# Patient Record
Sex: Male | Born: 2000 | Race: Black or African American | Hispanic: No | Marital: Single
Health system: Midwestern US, Community
[De-identification: ages and names within clinical notes are randomized; demographics above are authoritative.]

---
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
## 2019-11-11 ENCOUNTER — Emergency Department: Admit: 2019-11-12

## 2019-11-11 DIAGNOSIS — S8991XA Unspecified injury of right lower leg, initial encounter: Secondary | ICD-10-CM

## 2019-11-11 NOTE — ED Notes (Signed)
Purposeful rounding completed. Toileting offered, ongoing plan of care discussed and pt???s concerns/questions addressed. Pain reassessed. Pt informed of time factors with lab/imaging study results. Pt resting on the stretcher in a position of comfort. Call bell within reach.

## 2019-11-11 NOTE — ED Notes (Signed)
Patient states "I can't take pills. Really, I cant's swallow them."    Motrin crushed and combined with pudding. Patient able to take medication.

## 2019-11-11 NOTE — ED Provider Notes (Signed)
History of ADHD.  He presents with right knee pain.  He states that he injured it while playing 7 on 7 football (not tackle) approximately 6 hours ago.  He states that it started after attempting to jump off his right leg while playing.  He has not been able to bear weight since then.  The pain is severe (10 out of 10) and worse with movement.  He has not noticed any swelling.  He does not remember if his knee bent awkwardly.  He has not had a history of previous knee problems.  No treatment prior to arrival.  No other injuries or complaints.           Past Medical History:   Diagnosis Date   ??? ADHD, hyperactive-impulsive type        No past surgical history on file.      Family History:   Problem Relation Age of Onset   ??? Liver Disease Maternal Uncle    ??? Heart Disease Maternal Uncle    ??? Diabetes Maternal Grandmother    ??? Hypertension Maternal Grandmother    ??? Stroke Other        Social History     Socioeconomic History   ??? Marital status: SINGLE     Spouse name: Not on file   ??? Number of children: Not on file   ??? Years of education: Not on file   ??? Highest education level: Not on file   Occupational History   ??? Not on file   Social Needs   ??? Financial resource strain: Not on file   ??? Food insecurity     Worry: Not on file     Inability: Not on file   ??? Transportation needs     Medical: Not on file     Non-medical: Not on file   Tobacco Use   ??? Smoking status: Current Every Day Smoker   ??? Smokeless tobacco: Never Used   Substance and Sexual Activity   ??? Alcohol use: No   ??? Drug use: No   ??? Sexual activity: Never   Lifestyle   ??? Physical activity     Days per week: Not on file     Minutes per session: Not on file   ??? Stress: Not on file   Relationships   ??? Social Wellsite geologist on phone: Not on file     Gets together: Not on file     Attends religious service: Not on file     Active member of club or organization: Not on file     Attends meetings of clubs or organizations: Not on file      Relationship status: Not on file   ??? Intimate partner violence     Fear of current or ex partner: Not on file     Emotionally abused: Not on file     Physically abused: Not on file     Forced sexual activity: Not on file   Other Topics Concern   ??? Not on file   Social History Narrative    ** Merged History Encounter **              ALLERGIES: Patient has no known allergies.    Review of Systems   All other systems reviewed and are negative.      There were no vitals filed for this visit.         Physical Exam  Vitals signs and  nursing note reviewed.   Constitutional:       Appearance: He is well-developed.   HENT:      Head: Normocephalic and atraumatic.   Eyes:      Conjunctiva/sclera: Conjunctivae normal.   Neck:      Trachea: No tracheal deviation.   Cardiovascular:      Rate and Rhythm: Normal rate.   Pulmonary:      Effort: Pulmonary effort is normal.   Abdominal:      General: There is no distension.   Musculoskeletal:      Comments: His right knee is diffusely tender.  No swelling noted.  He reports pain with valgus/varus stress and internal/external rotation.  Limited range of motion due to pain.  2+ pedal pulses.   Skin:     General: Skin is dry.   Neurological:      Mental Status: He is alert.          MDM       Procedures    Progress Note:  Results, treatment, and follow up plan have been discussed with patient.  Questions were answered.   Satira Anis, MD  9:07 PM    Assessment/plan: Right knee injury while playing football earlier today.  Unclear etiology.  He is very tender and resists range of motion.  However, no swelling is appreciated.  Neurovascularly intact.  X-ray showed no acute process.  Home with recommendations of rest, ice, elevation, ibuprofen, and orthopedic follow-up.  We will provide an Ace wrap and crutches.  Satira Anis, MD  9:08 PM

## 2019-11-11 NOTE — ED Notes (Signed)
Assumed care of patient in [facility] 3. Patient arrived via wheelchair. States has not been able to ambulate since injury; "someone carried me."

## 2019-11-11 NOTE — ED Notes (Signed)
The patient was discharged home by provider in stable condition. The patient is alert and oriented, in no respiratory distress and discharge vital signs obtained. The patient's diagnosis, condition and treatment were explained. The patient expressed understanding. No prescriptions given. No work/school note given. A discharge plan has been developed. A case manager was not involved in the process. Aftercare instructions were given.   Pt discharged from the ED via w/c by   By RN to family in parking lot.

## 2019-11-11 NOTE — ED Triage Notes (Signed)
Patient complains of right knee pain while jumping during football today.  Unaware of any specific trauma.  Patient reports difficulty with walking

## 2019-11-11 NOTE — ED Notes (Signed)
 Purposeful rounding completed. Toileting offered, ongoing plan of care discussed and pt's concerns/questions addressed. Pain reassessed. Pt informed of time factors with lab/imaging study results. Pt resting on the stretcher in a position of comfort. Call bell within reach.

## 2019-11-11 NOTE — ED Notes (Signed)
 Patient states I can't take pills. Really, I cant's swallow them.    Motrin crushed and combined with pudding. Patient able to take medication.

## 2019-11-11 NOTE — ED Notes (Signed)
Patient complains of right knee pain while jumping during football today.  Unaware of any specific trauma.  Patient reports difficulty with walking

## 2019-11-11 NOTE — ED Notes (Signed)
The patient was discharged home by provider in stable condition. The patient is alert and oriented, in no respiratory distress and discharge vital signs obtained. The patient's diagnosis, condition and treatment were explained. The patient expressed understanding. No prescriptions given. No work/school note given. A discharge plan has been developed. A case manager was not involved in the process. Aftercare instructions were given.   Pt discharged from the ED via w/c by   By RN to family in parking lot.

## 2019-11-11 NOTE — ED Notes (Signed)
 Assumed care of patient in [facility]. Patient arrived via wheelchair. States has not been able to ambulate since injury; someone carried me.

## 2019-11-11 NOTE — ED Provider Notes (Signed)
History of ADHD.  He presents with right knee pain.  He states that he injured it while playing 7 on 7 football (not tackle) approximately 6 hours ago.  He states that it started after attempting to jump off his right leg while playing.  He has not been able to bear weight since then.  The pain is severe (10 out of 10) and worse with movement.  He has not noticed any swelling.  He does not remember if his knee bent awkwardly.  He has not had a history of previous knee problems.  No treatment prior to arrival.  No other injuries or complaints.           Past Medical History:   Diagnosis Date   ??? ADHD, hyperactive-impulsive type        No past surgical history on file.      Family History:   Problem Relation Age of Onset   ??? Liver Disease Maternal Uncle    ??? Heart Disease Maternal Uncle    ??? Diabetes Maternal Grandmother    ??? Hypertension Maternal Grandmother    ??? Stroke Other        Social History     Socioeconomic History   ??? Marital status: SINGLE     Spouse name: Not on file   ??? Number of children: Not on file   ??? Years of education: Not on file   ??? Highest education level: Not on file   Occupational History   ??? Not on file   Social Needs   ??? Financial resource strain: Not on file   ??? Food insecurity     Worry: Not on file     Inability: Not on file   ??? Transportation needs     Medical: Not on file     Non-medical: Not on file   Tobacco Use   ??? Smoking status: Current Every Day Smoker   ??? Smokeless tobacco: Never Used   Substance and Sexual Activity   ??? Alcohol use: No   ??? Drug use: No   ??? Sexual activity: Never   Lifestyle   ??? Physical activity     Days per week: Not on file     Minutes per session: Not on file   ??? Stress: Not on file   Relationships   ??? Social Wellsite geologist on phone: Not on file     Gets together: Not on file     Attends religious service: Not on file     Active member of club or organization: Not on file     Attends meetings of clubs or organizations: Not on file     Relationship  status: Not on file   ??? Intimate partner violence     Fear of current or ex partner: Not on file     Emotionally abused: Not on file     Physically abused: Not on file     Forced sexual activity: Not on file   Other Topics Concern   ??? Not on file   Social History Narrative    ** Merged History Encounter **              ALLERGIES: Patient has no known allergies.    Review of Systems   All other systems reviewed and are negative.      There were no vitals filed for this visit.         Physical Exam  Vitals signs and  nursing note reviewed.   Constitutional:       Appearance: He is well-developed.   HENT:      Head: Normocephalic and atraumatic.   Eyes:      Conjunctiva/sclera: Conjunctivae normal.   Neck:      Trachea: No tracheal deviation.   Cardiovascular:      Rate and Rhythm: Normal rate.   Pulmonary:      Effort: Pulmonary effort is normal.   Abdominal:      General: There is no distension.   Musculoskeletal:      Comments: His right knee is diffusely tender.  No swelling noted.  He reports pain with valgus/varus stress and internal/external rotation.  Limited range of motion due to pain.  2+ pedal pulses.   Skin:     General: Skin is dry.   Neurological:      Mental Status: He is alert.          MDM       Procedures    Progress Note:  Results, treatment, and follow up plan have been discussed with patient.  Questions were answered.   Satira Anis, MD  9:07 PM    Assessment/plan: Right knee injury while playing football earlier today.  Unclear etiology.  He is very tender and resists range of motion.  However, no swelling is appreciated.  Neurovascularly intact.  X-ray showed no acute process.  Home with recommendations of rest, ice, elevation, ibuprofen, and orthopedic follow-up.  We will provide an Ace wrap and crutches.  Satira Anis, MD  9:08 PM

## 2019-11-12 ENCOUNTER — Inpatient Hospital Stay
Admit: 2019-11-12 | Discharge: 2019-11-12 | Disposition: A | Discharge status: Home / Self Care | Attending: Emergency Medicine

## 2019-11-12 MED ORDER — IBUPROFEN 800 MG TAB
800 mg | ORAL | Status: AC
Start: 2019-11-12 — End: 2019-11-11
  Administered 2019-11-12: 01:00:00 via ORAL

## 2019-11-12 MED FILL — IBUPROFEN 800 MG TAB: 800 mg | ORAL | Qty: 1

## 2020-03-24 ENCOUNTER — Other Ambulatory Visit: Payer: Self-pay

## 2020-03-24 ENCOUNTER — Emergency Department (HOSPITAL_COMMUNITY): Payer: Medicaid Other

## 2020-03-24 ENCOUNTER — Emergency Department (HOSPITAL_COMMUNITY)
Admission: EM | Admit: 2020-03-24 | Discharge: 2020-03-24 | Disposition: A | Discharge status: Home / Self Care | Payer: Medicaid Other | Attending: Emergency Medicine | Admitting: Emergency Medicine

## 2020-03-24 ENCOUNTER — Encounter (HOSPITAL_COMMUNITY): Payer: Self-pay | Admitting: Emergency Medicine

## 2020-03-24 DIAGNOSIS — S62316A Displaced fracture of base of fifth metacarpal bone, right hand, initial encounter for closed fracture: Secondary | ICD-10-CM | POA: Diagnosis not present

## 2020-03-24 DIAGNOSIS — S62347A Nondisplaced fracture of base of fifth metacarpal bone. left hand, initial encounter for closed fracture: Secondary | ICD-10-CM | POA: Diagnosis not present

## 2020-03-24 DIAGNOSIS — Y92239 Unspecified place in hospital as the place of occurrence of the external cause: Secondary | ICD-10-CM | POA: Diagnosis not present

## 2020-03-24 DIAGNOSIS — Y999 Unspecified external cause status: Secondary | ICD-10-CM | POA: Insufficient documentation

## 2020-03-24 DIAGNOSIS — W2209XA Striking against other stationary object, initial encounter: Secondary | ICD-10-CM | POA: Insufficient documentation

## 2020-03-24 DIAGNOSIS — Y9389 Activity, other specified: Secondary | ICD-10-CM | POA: Diagnosis not present

## 2020-03-24 DIAGNOSIS — S62346A Nondisplaced fracture of base of fifth metacarpal bone, right hand, initial encounter for closed fracture: Secondary | ICD-10-CM | POA: Diagnosis not present

## 2020-03-24 DIAGNOSIS — S6991XA Unspecified injury of right wrist, hand and finger(s), initial encounter: Secondary | ICD-10-CM | POA: Diagnosis present

## 2020-03-24 DIAGNOSIS — S62306A Unspecified fracture of fifth metacarpal bone, right hand, initial encounter for closed fracture: Secondary | ICD-10-CM

## 2020-03-24 NOTE — ED Triage Notes (Addendum)
C/o R hand pain and swelling x 1 week.  States he punched glass at hospital last week.

## 2020-03-24 NOTE — ED Provider Notes (Signed)
[facility] Provider Note   CSN: [id number] Arrival date & time: 03/24/20  1039     History Chief Complaint  Patient presents with  . Hand Pain    [facility] is a 19 y.o. male.  19 year old male presents with complaint of pain in his right hand.  Patient states that he got angry and punched a window at the hospital last week, has had pain in his hand since that time.  No prior hand injuries.  Pain located over the right fifth MCP, no wounds to the hand, no other complaints or concerns.        History reviewed. No pertinent past medical history.  There are no problems to display for this patient.   History reviewed. No pertinent surgical history.     No family history on file.  Social History   Tobacco Use  . Smoking status: Never Smoker  . Smokeless tobacco: Never Used  Substance Use Topics  . Alcohol use: Never  . Drug use: Yes    Types: Marijuana    Home Medications Prior to Admission medications   Medication Sig Start Date End Date Taking? Authorizing Provider  ibuprofen (ADVIL) 200 MG tablet Take 200 mg by mouth every 6 (six) hours as needed for mild pain.   Yes [provider]  ibuprofen (ADVIL,MOTRIN) 100 MG/5ML suspension Take 30 mLs (600 mg total) by mouth every 6 (six) hours as needed. Patient not taking: Reported on 03/24/2020 04/01/18   Elisha Ponder, PA-C    Allergies    Patient has no known allergies.  Review of Systems   Review of Systems  Constitutional: Negative for fever.  Musculoskeletal: Positive for arthralgias and joint swelling.  Skin: Negative for color change and wound.  Allergic/Immunologic: Negative for immunocompromised state.  Neurological: Negative for weakness and numbness.    Physical Exam Updated Vital Signs BP 127/71 (BP Location: Left Arm)   Pulse 64   Temp (!) 97.5 F (36.4 C) (Oral)   Resp 16   SpO2 100%   Physical Exam Vitals and nursing note reviewed.   Constitutional:      General: He is not in acute distress.    Appearance: He is well-developed. He is not diaphoretic.  HENT:     Head: Normocephalic and atraumatic.  Pulmonary:     Effort: Pulmonary effort is normal.  Musculoskeletal:        General: Swelling and tenderness present.     Right hand: Swelling, tenderness and bony tenderness present. No lacerations. Decreased range of motion. Normal sensation.       Arms:  Skin:    General: Skin is warm and dry.     Capillary Refill: Capillary refill takes less than 2 seconds.     Findings: No erythema or rash.  Neurological:     Mental Status: He is alert and oriented to person, place, and time.  Psychiatric:        Behavior: Behavior normal.     ED Results / Procedures / Treatments   Labs (all labs ordered are listed, but only abnormal results are displayed) Labs Reviewed - No data to display  EKG None  Radiology DG Hand Complete Right  Result Date: 03/24/2020 CLINICAL DATA:  Pain after punching glass EXAM: RIGHT HAND - COMPLETE 3+ VIEW COMPARISON:  None. FINDINGS: Frontal, oblique, and lateral views were obtained. There is a comminuted fracture of the distal fifth metacarpal with volar angulation distally. There is soft tissue swelling  in this area. No other fracture. No dislocation. Joint spaces appear normal. No radiopaque foreign body. IMPRESSION: Comminuted fracture distal fifth metacarpal with volar angulation distally. There is soft tissue swelling in this area medially. No radiopaque foreign body. No other fracture. No dislocation. No evident arthropathy. Electronically Signed   By: Lowella Grip III M.D.   On: 03/24/2020 12:05    Procedures Procedures (including critical care time)  Medications Ordered in ED Medications - No data to display  ED Course  I have reviewed the triage vital signs and the nursing notes.  Pertinent labs & imaging results that were available during my care of the patient were  reviewed by me and considered in my medical decision making (see chart for details).  Clinical Course as of Mar 24 1232  Sun Mar 24, 2020  1232 19yo male with right hand injury after punching a window last week. Patient is right hand dominant, TTP with swelling over right 5th MCP, sensation intact, skin intact, able to flex and extend the finger with discomfort. XR with boxers fx, placed in ulnar gutter splint and referred to hand ortho.   [LM]    Clinical Course User Index [LM] Roque Lias   MDM Rules/Calculators/A&P                         SPLINT APPLICATION Date/Time: 27:78 PM Authorized by: Tacy Learn Consent: Verbal consent obtained. Risks and benefits: risks, benefits and alternatives were discussed Consent given by: patient Splint applied by: orthopedic technician Location details: right forearm Splint type: ulnar gutter Supplies used: fiberglass OCL, ace, webril Post-procedure: The splinted body part was neurovascularly unchanged following the procedure. Patient tolerance: Patient tolerated the procedure well with no immediate complications.    Final Clinical Impression(s) / ED Diagnoses Final diagnoses:  Closed nondisplaced fracture of fifth metacarpal bone of right hand, unspecified portion of metacarpal, initial encounter    Rx / DC Orders ED Discharge Orders    None       Tacy Learn, PA-C 03/24/20 1233    Isla Pence, MD 03/24/20 1440

## 2020-03-24 NOTE — Discharge Instructions (Addendum)
Keep splint on, clean, and dry. Follow up with orthopedics. You can elevate the hand and apply ice on top of the splint for 20 minutes three times daily to help with pain and swelling. Take Motrin and Tylenol as needed as directed for pain.

## 2020-03-24 NOTE — Progress Notes (Signed)
Orthopedic Tech Progress Note Patient Details:  [facility] 26-Sep-2001 [id number]  Ortho Devices Type of Ortho Device: Ulna gutter splint Ortho Device/Splint Location: RUE Ortho Device/Splint Interventions: Application, Ordered   Post Interventions Patient Tolerated: Well Instructions Provided: Care of device   Oliana Gowens A Shaliah Wann 03/24/2020, 1:01 PM

## 2020-03-25 DIAGNOSIS — S62306A Unspecified fracture of fifth metacarpal bone, right hand, initial encounter for closed fracture: Secondary | ICD-10-CM | POA: Diagnosis not present

## 2020-04-10 DIAGNOSIS — S62306D Unspecified fracture of fifth metacarpal bone, right hand, subsequent encounter for fracture with routine healing: Secondary | ICD-10-CM | POA: Diagnosis not present

## 2020-04-10 DIAGNOSIS — S62306A Unspecified fracture of fifth metacarpal bone, right hand, initial encounter for closed fracture: Secondary | ICD-10-CM | POA: Diagnosis not present

## 2020-05-21 ENCOUNTER — Ambulatory Visit: Payer: Self-pay

## 2020-05-21 ENCOUNTER — Ambulatory Visit
Admission: EM | Admit: 2020-05-21 | Discharge: 2020-05-21 | Disposition: A | Discharge status: Home / Self Care | Payer: Medicaid Other | Attending: Emergency Medicine | Admitting: Emergency Medicine

## 2020-05-21 DIAGNOSIS — Z202 Contact with and (suspected) exposure to infections with a predominantly sexual mode of transmission: Secondary | ICD-10-CM | POA: Insufficient documentation

## 2020-05-21 DIAGNOSIS — R369 Urethral discharge, unspecified: Secondary | ICD-10-CM

## 2020-05-21 MED ORDER — DOXYCYCLINE HYCLATE 100 MG PO CAPS
100.0000 mg | ORAL_CAPSULE | Freq: Two times a day (BID) | ORAL | 0 refills | Status: AC
Start: 2020-05-21 — End: 2020-05-28

## 2020-05-21 MED ORDER — CEFTRIAXONE SODIUM 500 MG IJ SOLR
500.0000 mg | Freq: Once | INTRAMUSCULAR | Status: AC
  Administered 2020-05-21: 500 mg via INTRAMUSCULAR

## 2020-05-21 NOTE — ED Provider Notes (Signed)
Renaldo Fiddler    CSN: [id number] Arrival date & time: 05/21/20  1001      History   Chief Complaint Chief Complaint  Patient presents with  . Exposure to STD    HPI Blake Hoffman is a 19 y.o. male.   Patient presents with thick yellow discharge from his penis today.  He reports known exposure to gonorrhea and chlamydia.  He denies fever, chills, rash, lesions, abdominal pain, dysuria, back pain, testicular pain, or other symptoms.  He is sexually active and does not use condoms.  The history is provided by the patient.    History reviewed. No pertinent past medical history.  There are no problems to display for this patient.   History reviewed. No pertinent surgical history.     Home Medications    Prior to Admission medications   Medication Sig Start Date End Date Taking? Authorizing Provider  doxycycline (VIBRAMYCIN) 100 MG capsule Take 1 capsule (100 mg total) by mouth 2 (two) times daily for 7 days. 05/21/20 05/28/20  Mickie Bail, NP    Family History History reviewed. No pertinent family history.  Social History Social History   Tobacco Use  . Smoking status: Never Smoker  . Smokeless tobacco: Never Used  Substance Use Topics  . Alcohol use: Not on file  . Drug use: Not on file     Allergies   Patient has no allergy information on record.   Review of Systems Review of Systems  Constitutional: Negative for chills and fever.  HENT: Negative for ear pain and sore throat.   Eyes: Negative for pain and visual disturbance.  Respiratory: Negative for cough and shortness of breath.   Cardiovascular: Negative for chest pain and palpitations.  Gastrointestinal: Negative for abdominal pain and vomiting.  Genitourinary: Positive for discharge. Negative for dysuria, hematuria and testicular pain.  Musculoskeletal: Negative for arthralgias and back pain.  Skin: Negative for color change and rash.  Neurological: Negative for seizures and syncope.    All other systems reviewed and are negative.    Physical Exam Triage Vital Signs ED Triage Vitals  Enc Vitals Group     BP      Pulse      Resp      Temp      Temp src      SpO2      Weight      Height      Head Circumference      Peak Flow      Pain Score      Pain Loc      Pain Edu?      Excl. in GC?    No data found.  Updated Vital Signs BP 116/78 (BP Location: Right Arm)   Pulse 64   Temp 98.9 F (37.2 C) (Oral)   Resp 16   SpO2 98%   Visual Acuity Right Eye Distance:   Left Eye Distance:   Bilateral Distance:    Right Eye Near:   Left Eye Near:    Bilateral Near:     Physical Exam Vitals and nursing note reviewed.  Constitutional:      Appearance: He is well-developed.  HENT:     Head: Normocephalic and atraumatic.     Mouth/Throat:     Mouth: Mucous membranes are moist.  Eyes:     Conjunctiva/sclera: Conjunctivae normal.  Cardiovascular:     Rate and Rhythm: Normal rate and regular rhythm.     Heart sounds:  No murmur heard.   Pulmonary:     Effort: Pulmonary effort is normal. No respiratory distress.     Breath sounds: Normal breath sounds.  Abdominal:     Palpations: Abdomen is soft.     Tenderness: There is no abdominal tenderness. There is no right CVA tenderness, left CVA tenderness, guarding or rebound.  Genitourinary:    Penis: Discharge present.      Testes: Normal.     Epididymis:     Right: Normal.     Left: Normal.     Comments: Thick yellow penile discharge. Musculoskeletal:     Cervical back: Neck supple.  Skin:    General: Skin is warm and dry.  Neurological:     Mental Status: He is alert.      UC Treatments / Results  Labs (all labs ordered are listed, but only abnormal results are displayed) Labs Reviewed  RPR  HIV ANTIBODY (ROUTINE TESTING W REFLEX)  CYTOLOGY, (ORAL, ANAL, URETHRAL) ANCILLARY ONLY    EKG   Radiology No results found.  Procedures Procedures (including critical care  time)  Medications Ordered in UC Medications  cefTRIAXone (ROCEPHIN) injection 500 mg (has no administration in time range)    Initial Impression / Assessment and Plan / UC Course  I have reviewed the triage vital signs and the nursing notes.  Pertinent labs & imaging results that were available during my care of the patient were reviewed by me and considered in my medical decision making (see chart for details).   Penile discharge, exposure to STD.  Urethral swab obtained for testing.  Blood obtained for HIV and RPR.  Treated with Rocephin and doxycycline.  Instructed patient to abstain from sexual activity for 7 days.  Discussed that his test results are pending and we will call him if they are positive.  Discussed that he and his sexual partners may require additional treatment.  Patient agrees to plan of care.   Final Clinical Impressions(s) / UC Diagnoses   Final diagnoses:  Exposure to STD  Penile discharge     Discharge Instructions     You were treated with an antibiotic today called Rocephin.  Take doxycycline twice a day for 7 days.    Do not have sex for 7 days.  Your tests are pending.  If your test results are positive, we will call you.  You may need additional treatment and your partner(s) may also need treatment.      Follow up with your primary care provider if your symptoms are not improving.        ED Prescriptions    Medication Sig Dispense Auth. Provider   doxycycline (VIBRAMYCIN) 100 MG capsule Take 1 capsule (100 mg total) by mouth 2 (two) times daily for 7 days. 14 capsule Mickie Bail, NP     PDMP not reviewed this encounter.   Mickie Bail, NP 05/21/20 1030

## 2020-05-21 NOTE — ED Triage Notes (Signed)
Pt presents to UC for exposure to gonorrhea and chlamydemia . Pt denies symptoms at this time. Pt requesting testing and treatment.

## 2020-05-21 NOTE — Discharge Instructions (Addendum)
You were treated with an antibiotic today called Rocephin.  Take doxycycline twice a day for 7 days.    Do not have sex for 7 days.  Your tests are pending.  If your test results are positive, we will call you.  You may need additional treatment and your partner(s) may also need treatment.      Follow up with your primary care provider if your symptoms are not improving.

## 2020-05-22 LAB — HIV ANTIBODY (ROUTINE TESTING W REFLEX): HIV Screen 4th Generation wRfx: NONREACTIVE

## 2020-05-22 LAB — RPR: RPR Ser Ql: NONREACTIVE

## 2020-05-27 LAB — CYTOLOGY, (ORAL, ANAL, URETHRAL) ANCILLARY ONLY
Chlamydia: POSITIVE — AB
Comment: NEGATIVE
Comment: NEGATIVE
Comment: NORMAL
Neisseria Gonorrhea: POSITIVE — AB
Trichomonas: NEGATIVE

## 2020-05-28 ENCOUNTER — Telehealth (HOSPITAL_COMMUNITY): Payer: Self-pay | Admitting: Emergency Medicine

## 2020-05-28 MED ORDER — AZITHROMYCIN 200 MG/5ML PO SUSR
1000.0000 mg | Freq: Once | ORAL | 0 refills | Status: AC
Start: 2020-05-28 — End: 2020-05-28

## 2020-05-28 NOTE — Telephone Encounter (Signed)
Patient states he is unable to swallow the doxycycline pills sent to the pharmacy for him.  Will reach out to provider on guidance for possible suspension.

## 2020-06-11 ENCOUNTER — Encounter (HOSPITAL_COMMUNITY): Payer: Self-pay | Admitting: Emergency Medicine

## 2020-12-09 ENCOUNTER — Encounter (HOSPITAL_COMMUNITY): Payer: Self-pay

## 2020-12-09 ENCOUNTER — Other Ambulatory Visit: Payer: Self-pay

## 2020-12-09 ENCOUNTER — Ambulatory Visit (HOSPITAL_COMMUNITY)
Admission: EM | Admit: 2020-12-09 | Discharge: 2020-12-09 | Disposition: A | Discharge status: Home / Self Care | Payer: Medicaid Other | Attending: Emergency Medicine | Admitting: Emergency Medicine

## 2020-12-09 DIAGNOSIS — Z202 Contact with and (suspected) exposure to infections with a predominantly sexual mode of transmission: Secondary | ICD-10-CM | POA: Insufficient documentation

## 2020-12-09 MED ORDER — DOXYCYCLINE HYCLATE 100 MG PO CAPS: 100.0000 mg | ORAL_CAPSULE | Freq: Two times a day (BID) | ORAL | 0 refills | Status: AC

## 2020-12-09 NOTE — ED Triage Notes (Signed)
Pt in with c/o Std exposure. States his sex partner tested positive for chlamydia  Denies any sxs

## 2020-12-09 NOTE — ED Provider Notes (Signed)
[facility]    CSN: [id number] Arrival date & time: 12/09/20  1454      History   Chief Complaint Chief Complaint  Patient presents with  . Exposure to STD    HPI Philip Kotlyar is a 20 y.o. male.   Patient presents for treatment of chlamydia, known exposure. Denies discharge, itching, odor, dysuria, frequency, urgency, abdominal pain, flank pain.   History reviewed. No pertinent past medical history.  There are no problems to display for this patient.   History reviewed. No pertinent surgical history.     Home Medications    Prior to Admission medications   Medication Sig Start Date End Date Taking? Authorizing Provider  doxycycline (VIBRAMYCIN) 100 MG capsule Take 1 capsule (100 mg total) by mouth 2 (two) times daily. 12/09/20  Yes Othelia Riederer R, NP  ibuprofen (ADVIL) 200 MG tablet Take 200 mg by mouth every 6 (six) hours as needed for mild pain.    [provider]  ibuprofen (ADVIL,MOTRIN) 100 MG/5ML suspension Take 30 mLs (600 mg total) by mouth every 6 (six) hours as needed. Patient not taking: Reported on 03/24/2020 04/01/18   Delia Chimes    Family History History reviewed. No pertinent family history.  Social History Social History   Tobacco Use  . Smoking status: Never Smoker  . Smokeless tobacco: Never Used  Substance Use Topics  . Alcohol use: Never  . Drug use: Yes    Types: Marijuana     Allergies   Patient has no known allergies.   Review of Systems Review of Systems  Constitutional: Negative.   HENT: Negative.   Respiratory: Negative.   Gastrointestinal: Negative.   Genitourinary: Negative.   Skin: Negative.   Neurological: Negative.      Physical Exam Triage Vital Signs ED Triage Vitals  Enc Vitals Group     BP 12/09/20 1527 (!) 153/71     Pulse Rate 12/09/20 1527 68     Resp 12/09/20 1527 18     Temp 12/09/20 1527 98.3 F (36.8 C)     Temp src --      SpO2 12/09/20 1527 98 %      Weight --      Height --      Head Circumference --      Peak Flow --      Pain Score 12/09/20 1526 0     Pain Loc --      Pain Edu? --      Excl. in GC? --    No data found.  Updated Vital Signs BP (!) 153/71   Pulse 68   Temp 98.3 F (36.8 C)   Resp 18   SpO2 98%   Visual Acuity Right Eye Distance:   Left Eye Distance:   Bilateral Distance:    Right Eye Near:   Left Eye Near:    Bilateral Near:     Physical Exam Constitutional:      Appearance: Normal appearance. He is normal weight.  Eyes:     Extraocular Movements: Extraocular movements intact.  Pulmonary:     Effort: Pulmonary effort is normal.  Musculoskeletal:        General: Normal range of motion.     Cervical back: Normal range of motion.  Skin:    General: Skin is warm and dry.  Neurological:     General: No focal deficit present.     Mental Status: He is alert and oriented to person,  place, and time. Mental status is at baseline.  Psychiatric:        Mood and Affect: Mood normal.        Behavior: Behavior normal.        Thought Content: Thought content normal.        Judgment: Judgment normal.      UC Treatments / Results  Labs (all labs ordered are listed, but only abnormal results are displayed) Labs Reviewed  CYTOLOGY, (ORAL, ANAL, URETHRAL) ANCILLARY ONLY    EKG   Radiology No results found.  Procedures Procedures (including critical care time)  Medications Ordered in UC Medications - No data to display  Initial Impression / Assessment and Plan / UC Course  I have reviewed the triage vital signs and the nursing notes.  Pertinent labs & imaging results that were available during my care of the patient were reviewed by me and considered in my medical decision making (see chart for details).  STD exposure chlamydia  1. Doxycycline 100mg  bid for 7 days 2. aptima swab- self collect- results in 2-3 days, will treat as needed 3. Advised to refrain from sex until treatment  complete  Final Clinical Impressions(s) / UC Diagnoses   Final diagnoses:  STD exposure     Discharge Instructions     Take antibiotic twice a day for seven days, complete all medication, do not crush or break pill, you can place in applesauce, yogurt or pudding to make it easier to swallow    Refrain from sex until treatment is complete  Lab results in 2-3 days, will go to mychart is active, if any positive for any other infection you will be called for treatment     ED Prescriptions    Medication Sig Dispense Auth. Provider   doxycycline (VIBRAMYCIN) 100 MG capsule Take 1 capsule (100 mg total) by mouth 2 (two) times daily. 14 capsule Briann Sarchet, , NP     PDMP not reviewed this encounter.   Elita Boone, NP 12/09/20 1600

## 2020-12-09 NOTE — Discharge Instructions (Addendum)
Take antibiotic twice a day for seven days, complete all medication, do not crush or break pill, you can place in applesauce, yogurt or pudding to make it easier to swallow    Refrain from sex until treatment is complete  Lab results in 2-3 days, will go to mychart is active, if any positive for any other infection you will be called for treatment

## 2020-12-11 LAB — CYTOLOGY, (ORAL, ANAL, URETHRAL) ANCILLARY ONLY
Chlamydia: NEGATIVE
Comment: NEGATIVE
Comment: NEGATIVE
Comment: NORMAL
Neisseria Gonorrhea: NEGATIVE
Trichomonas: NEGATIVE

## 2020-12-22 ENCOUNTER — Emergency Department (HOSPITAL_COMMUNITY)
Admission: EM | Admit: 2020-12-22 | Discharge: 2020-12-22 | Disposition: A | Discharge status: Home / Self Care | Payer: Medicaid Other | Attending: Emergency Medicine | Admitting: Emergency Medicine

## 2020-12-22 ENCOUNTER — Emergency Department (HOSPITAL_COMMUNITY): Payer: Medicaid Other

## 2020-12-22 ENCOUNTER — Other Ambulatory Visit: Payer: Self-pay

## 2020-12-22 DIAGNOSIS — Y92321 Football field as the place of occurrence of the external cause: Secondary | ICD-10-CM | POA: Insufficient documentation

## 2020-12-22 DIAGNOSIS — S7011XA Contusion of right thigh, initial encounter: Secondary | ICD-10-CM | POA: Diagnosis not present

## 2020-12-22 DIAGNOSIS — W2101XA Struck by football, initial encounter: Secondary | ICD-10-CM | POA: Diagnosis not present

## 2020-12-22 DIAGNOSIS — S79921A Unspecified injury of right thigh, initial encounter: Secondary | ICD-10-CM | POA: Diagnosis not present

## 2020-12-22 MED ORDER — IBUPROFEN 800 MG PO TABS
800.0000 mg | ORAL_TABLET | Freq: Once | ORAL | Status: DC
  Filled 2020-12-22: qty 1

## 2020-12-22 MED ORDER — IBUPROFEN 600 MG PO TABS: 600.0000 mg | ORAL_TABLET | Freq: Four times a day (QID) | ORAL | 0 refills | Status: AC | PRN

## 2020-12-22 NOTE — Discharge Instructions (Signed)
No football until pain is gone.

## 2020-12-22 NOTE — ED Notes (Signed)
Pt drinking cold beverage, unable to obtain temperature.

## 2020-12-22 NOTE — ED Triage Notes (Signed)
C/o R thigh pain since being kneed in thigh while playing football yesterday.

## 2020-12-22 NOTE — ED Provider Notes (Signed)
[facility] EMERGENCY DEPARTMENT Provider Note   CSN: [id number] Arrival date & time: 12/22/20  1551     History Chief Complaint  Patient presents with  . Leg Pain    Blake Hoffman is a 20 y.o. male.  Pt presents to the ED today with right thigh pain.  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  The pt said it still hurts, so he came in for eval.  He's been able to walk.        No past medical history on file.  There are no problems to display for this patient.   No past surgical history on file.     No family history on file.  Social History   Tobacco Use  . Smoking status: Never Smoker  . Smokeless tobacco: Never Used  Substance Use Topics  . Alcohol use: Never  . Drug use: Yes    Types: Marijuana    Home Medications Prior to Admission medications   Medication Sig Start Date End Date Taking? Authorizing Provider  ibuprofen (ADVIL) 600 MG tablet Take 1 tablet (600 mg total) by mouth every 6 (six) hours as needed. 12/22/20  Yes Jacalyn Lefevre, MD  doxycycline (VIBRAMYCIN) 100 MG capsule Take 1 capsule (100 mg total) by mouth 2 (two) times daily. 12/09/20   Valinda Hoar, NP    Allergies    Patient has no known allergies.  Review of Systems   Review of Systems  Musculoskeletal:       Right thigh pain  All other systems reviewed and are negative.   Physical Exam Updated Vital Signs BP 129/65 (BP Location: Right Arm)   Pulse 99   Resp 18   SpO2 100%   Physical Exam Vitals and nursing note reviewed.  Constitutional:      Appearance: Normal appearance.  HENT:     Head: Normocephalic and atraumatic.     Right Ear: External ear normal.     Left Ear: External ear normal.     Nose: Nose normal.     Mouth/Throat:     Mouth: Mucous membranes are moist.     Pharynx: Oropharynx is clear.  Eyes:     Extraocular Movements: Extraocular movements intact.      Conjunctiva/sclera: Conjunctivae normal.     Pupils: Pupils are equal, round, and reactive to light.  Cardiovascular:     Rate and Rhythm: Normal rate and regular rhythm.     Pulses: Normal pulses.     Heart sounds: Normal heart sounds.  Pulmonary:     Effort: Pulmonary effort is normal.     Breath sounds: Normal breath sounds.  Abdominal:     General: Abdomen is flat. Bowel sounds are normal.     Palpations: Abdomen is soft.  Musculoskeletal:     Cervical back: Normal range of motion and neck supple.       Legs:  Skin:    General: Skin is warm and dry.     Capillary Refill: Capillary refill takes less than 2 seconds.  Neurological:     General: No focal deficit present.     Mental Status: He is alert and oriented to person, place, and time.  Psychiatric:        Mood and Affect: Mood normal.        Behavior: Behavior normal.     ED Results / Procedures / Treatments   Labs (all labs ordered are  listed, but only abnormal results are displayed) Labs Reviewed - No data to display  EKG None  Radiology DG Femur Min 2 Views Right  Result Date: 12/22/2020 CLINICAL DATA:  Status post trauma. EXAM: RIGHT FEMUR 2 VIEWS COMPARISON:  None. FINDINGS: There is no evidence of fracture or other focal bone lesions. Soft tissues are unremarkable. IMPRESSION: Negative. Electronically Signed   By: Aram Candela M.D.   On: 12/22/2020 16:38    Procedures Procedures   Medications Ordered in ED Medications  ibuprofen (ADVIL) tablet 800 mg (800 mg Oral Patient Refused/Not Given 12/22/20 1635)    ED Course  I have reviewed the triage vital signs and the nursing notes.  Pertinent labs & imaging results that were available during my care of the patient were reviewed by me and considered in my medical decision making (see chart for details).    MDM Rules/Calculators/A&P                          No evidence of fx.  Pt is told to refrain from playing fb until pain is better.  He is to  talk with his athletic trainer.  He is given a note for work.  Return if worse.   Final Clinical Impression(s) / ED Diagnoses Final diagnoses:  Contusion of right thigh, initial encounter    Rx / DC Orders ED Discharge Orders         Ordered    ibuprofen (ADVIL) 600 MG tablet  Every 6 hours PRN        12/22/20 1644           Jacalyn Lefevre, MD 12/22/20 1646

## 2021-04-20 ENCOUNTER — Emergency Department (HOSPITAL_COMMUNITY)
Admission: EM | Admit: 2021-04-20 | Discharge: 2021-04-21 | Disposition: A | Discharge status: Home / Self Care | Payer: Medicaid Other | Attending: Emergency Medicine | Admitting: Emergency Medicine

## 2021-04-20 DIAGNOSIS — Y990 Civilian activity done for income or pay: Secondary | ICD-10-CM | POA: Insufficient documentation

## 2021-04-20 DIAGNOSIS — S60511A Abrasion of right hand, initial encounter: Secondary | ICD-10-CM | POA: Diagnosis not present

## 2021-04-20 DIAGNOSIS — S6991XA Unspecified injury of right wrist, hand and finger(s), initial encounter: Secondary | ICD-10-CM | POA: Diagnosis not present

## 2021-04-20 DIAGNOSIS — M79641 Pain in right hand: Secondary | ICD-10-CM

## 2021-04-20 DIAGNOSIS — X500XXA Overexertion from strenuous movement or load, initial encounter: Secondary | ICD-10-CM | POA: Diagnosis not present

## 2021-04-21 ENCOUNTER — Other Ambulatory Visit: Payer: Self-pay

## 2021-04-21 ENCOUNTER — Emergency Department (HOSPITAL_COMMUNITY): Payer: Medicaid Other

## 2021-04-21 DIAGNOSIS — M79641 Pain in right hand: Secondary | ICD-10-CM | POA: Diagnosis not present

## 2021-04-21 DIAGNOSIS — S6991XA Unspecified injury of right wrist, hand and finger(s), initial encounter: Secondary | ICD-10-CM | POA: Diagnosis not present

## 2021-04-21 NOTE — ED Provider Notes (Signed)
Emergency Medicine Provider Triage Evaluation Note  Blake Hoffman , a 20 y.o. male  was evaluated in triage.  Pt complains of right hand pain.  States he was playing football and his hand was stepped on.  Reports pain with use.  Needs clearance to return to work.  Review of Systems  Positive: Hand pain, swelling Negative: Numbness, weakness  Physical Exam  Ht 5\' 8"  (1.727 m)   Wt 66.7 kg   BMI 22.35 kg/m  Gen:   Awake, no distress   Resp:  Normal effort  MSK:   Mild swelling of right hand Other:    Medical Decision Making  Medically screening exam initiated at 12:07 AM.  Appropriate orders placed.  Blake Hoffman was informed that the remainder of the evaluation will be completed by another provider, this initial triage assessment does not replace that evaluation, and the importance of remaining in the ED until their evaluation is complete.   Rulon Eisenmenger, PA-C 04/21/21 0008    Mesner, 06/22/21, MD 04/21/21 [phone]

## 2021-04-21 NOTE — ED Triage Notes (Signed)
Pt states  yesterday around 6 pm sustained an injury to right hand when someone stepped on right hand.

## 2021-04-21 NOTE — ED Provider Notes (Signed)
[facility] EMERGENCY DEPARTMENT Provider Note   CSN: [id number] Arrival date & time: 04/20/21  2315     History Chief Complaint  Patient presents with   Right Hand Injury    Arther Heisler is a 20 y.o. male.  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  He had some pain and swelling to that area and was at work lifting boxes when they told him he needed to get medical clearance for the same.  Patient denies any other problems at this time.       No past medical history on file.  There are no problems to display for this patient.   No past surgical history on file.     No family history on file.  Social History   Tobacco Use   Smoking status: Never   Smokeless tobacco: Never  Substance Use Topics   Alcohol use: Never   Drug use: Yes    Types: Marijuana    Home Medications Prior to Admission medications   Medication Sig Start Date End Date Taking? Authorizing Provider  doxycycline (VIBRAMYCIN) 100 MG capsule Take 1 capsule (100 mg total) by mouth 2 (two) times daily. 12/09/20   Valinda Hoar, NP  ibuprofen (ADVIL) 600 MG tablet Take 1 tablet (600 mg total) by mouth every 6 (six) hours as needed. 12/22/20   Jacalyn Lefevre, MD    Allergies    Patient has no known allergies.  Review of Systems   Review of Systems  All other systems reviewed and are negative.  Physical Exam Updated Vital Signs BP (!) 139/97 (BP Location: Right Arm)   Pulse 63   Temp 97.6 F (36.4 C) (Oral)   Resp 16   Ht 5\' 8"  (1.727 m)   Wt 66.7 kg   SpO2 99%   BMI 22.35 kg/m   Physical Exam Vitals and nursing note reviewed.  Constitutional:      Appearance: He is well-developed.  HENT:     Head: Normocephalic and atraumatic.     Mouth/Throat:     Mouth: Mucous membranes are moist.     Pharynx: Oropharynx is clear.  Eyes:     Pupils: Pupils are equal, round, and reactive to light.  Cardiovascular:     Rate  and Rhythm: Normal rate.  Pulmonary:     Effort: Pulmonary effort is normal. No respiratory distress.  Abdominal:     General: Abdomen is flat. There is no distension.  Musculoskeletal:        General: Normal range of motion.     Cervical back: Normal range of motion.  Skin:    General: Skin is warm and dry.     Comments: Has a couple small abrasions to the dorsum of his right hand no obvious deformities or significant swelling for that matter..    Neurological:     General: No focal deficit present.     Mental Status: He is alert.    ED Results / Procedures / Treatments   Labs (all labs ordered are listed, but only abnormal results are displayed) Labs Reviewed - No data to display  EKG None  Radiology DG Hand Complete Right  Result Date: 04/21/2021 CLINICAL DATA:  Someone stepped on hand with pain, initial encounter EXAM: RIGHT HAND - COMPLETE 3+ VIEW COMPARISON:  03/24/2020 FINDINGS: Previously seen fifth metacarpal fracture has healed with mild curvature distally. No acute fracture or dislocation is noted. No soft tissue  abnormality is seen. No radiopaque foreign body is noted. IMPRESSION: No acute abnormality noted. Healed fifth metacarpal fracture. Electronically Signed   By: Alcide Clever M.D.   On: 04/21/2021 00:38    Procedures Procedures   Medications Ordered in ED Medications - No data to display  ED Course  I have reviewed the triage vital signs and the nursing notes.  Pertinent labs & imaging results that were available during my care of the patient were reviewed by me and considered in my medical decision making (see chart for details).    MDM Rules/Calculators/A&P                          X-ray negative.  Work note provided.  Will use anti-inflammatories at home.  Final Clinical Impression(s) / ED Diagnoses Final diagnoses:  Pain of right hand    Rx / DC Orders ED Discharge Orders     None        Jahira Swiss, Barbara Cower, MD 04/21/21 [phone]

## 2021-04-30 IMAGING — CR DG FEMUR 2+V*R*
4 series · 4 of 4 positions shown · non-contrast
Comparison: None.

CLINICAL DATA: Status post trauma.

EXAM:
RIGHT FEMUR 2 VIEWS

[femur ap (1 of 2)]
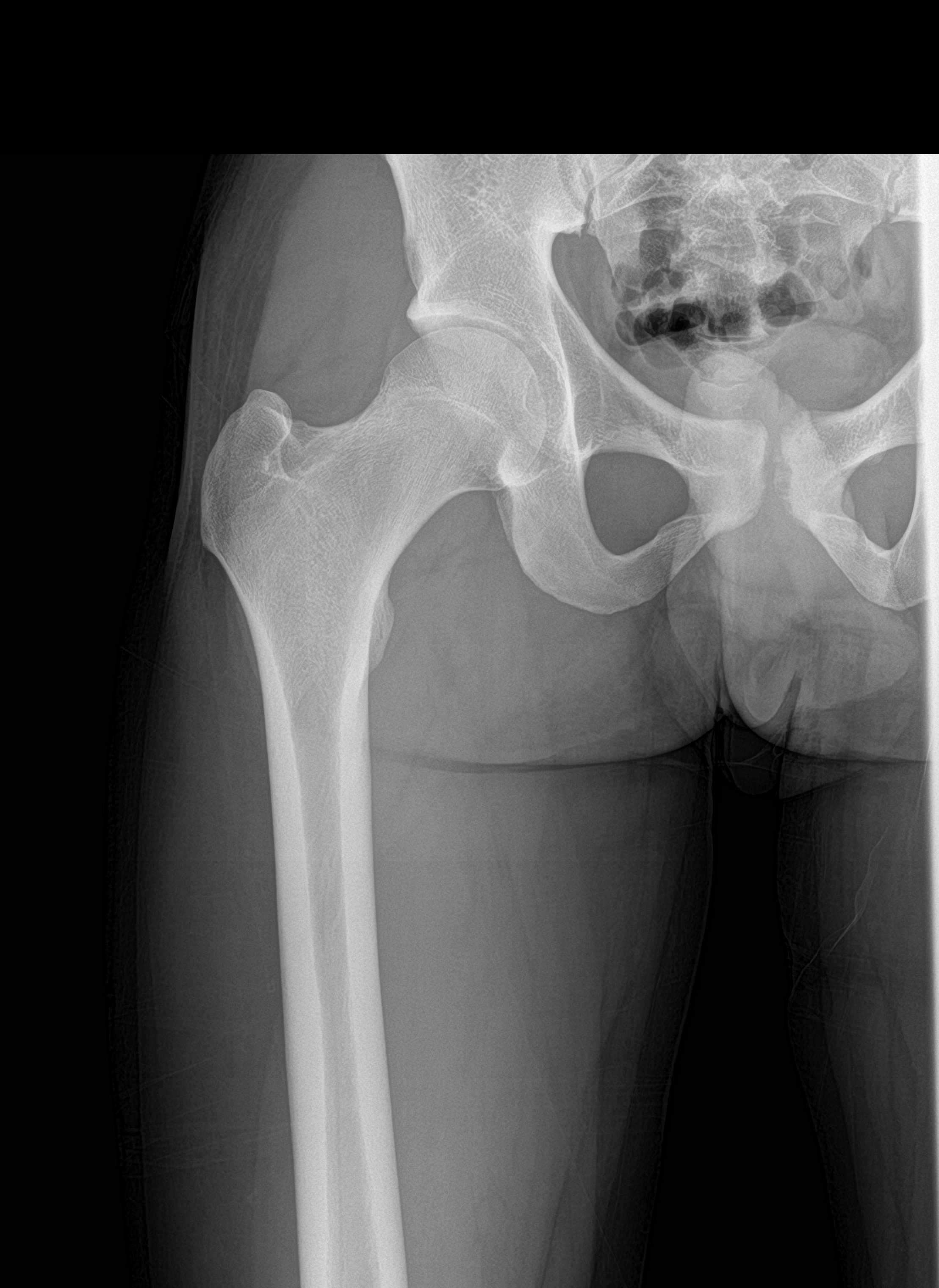

[femur ap (2 of 2)]
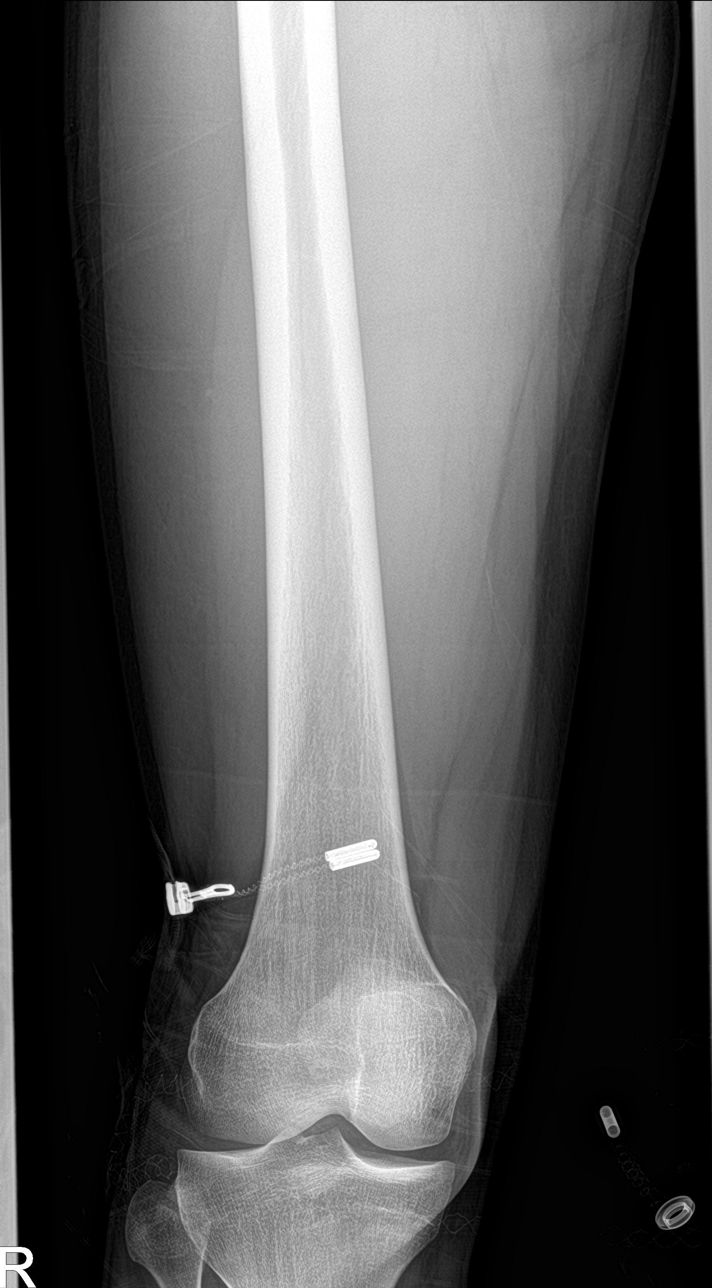

[femur lat (1 of 2)]
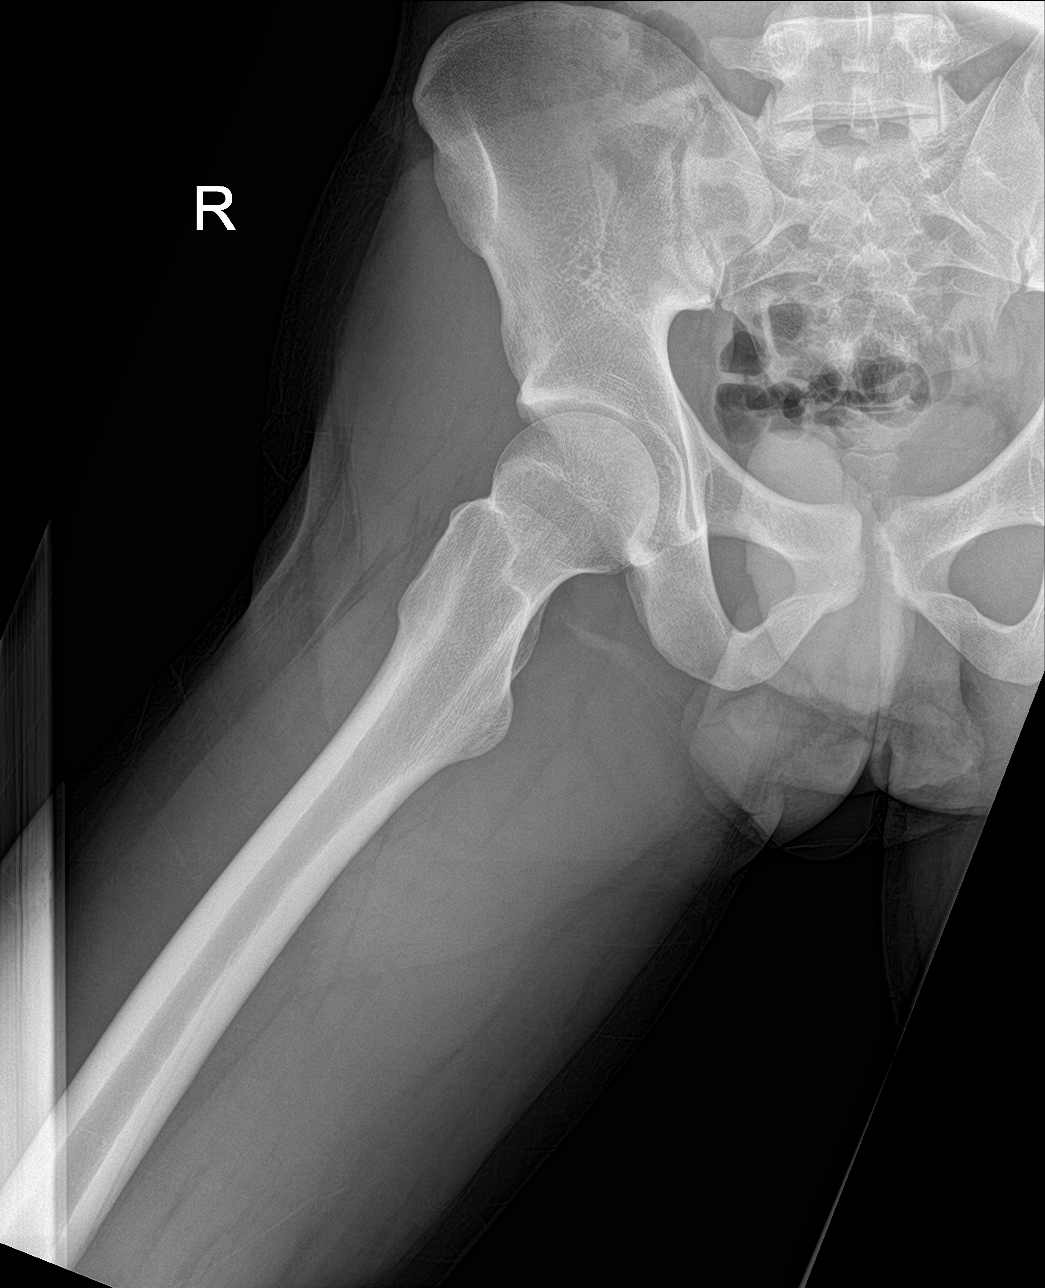

[femur lat (2 of 2)]
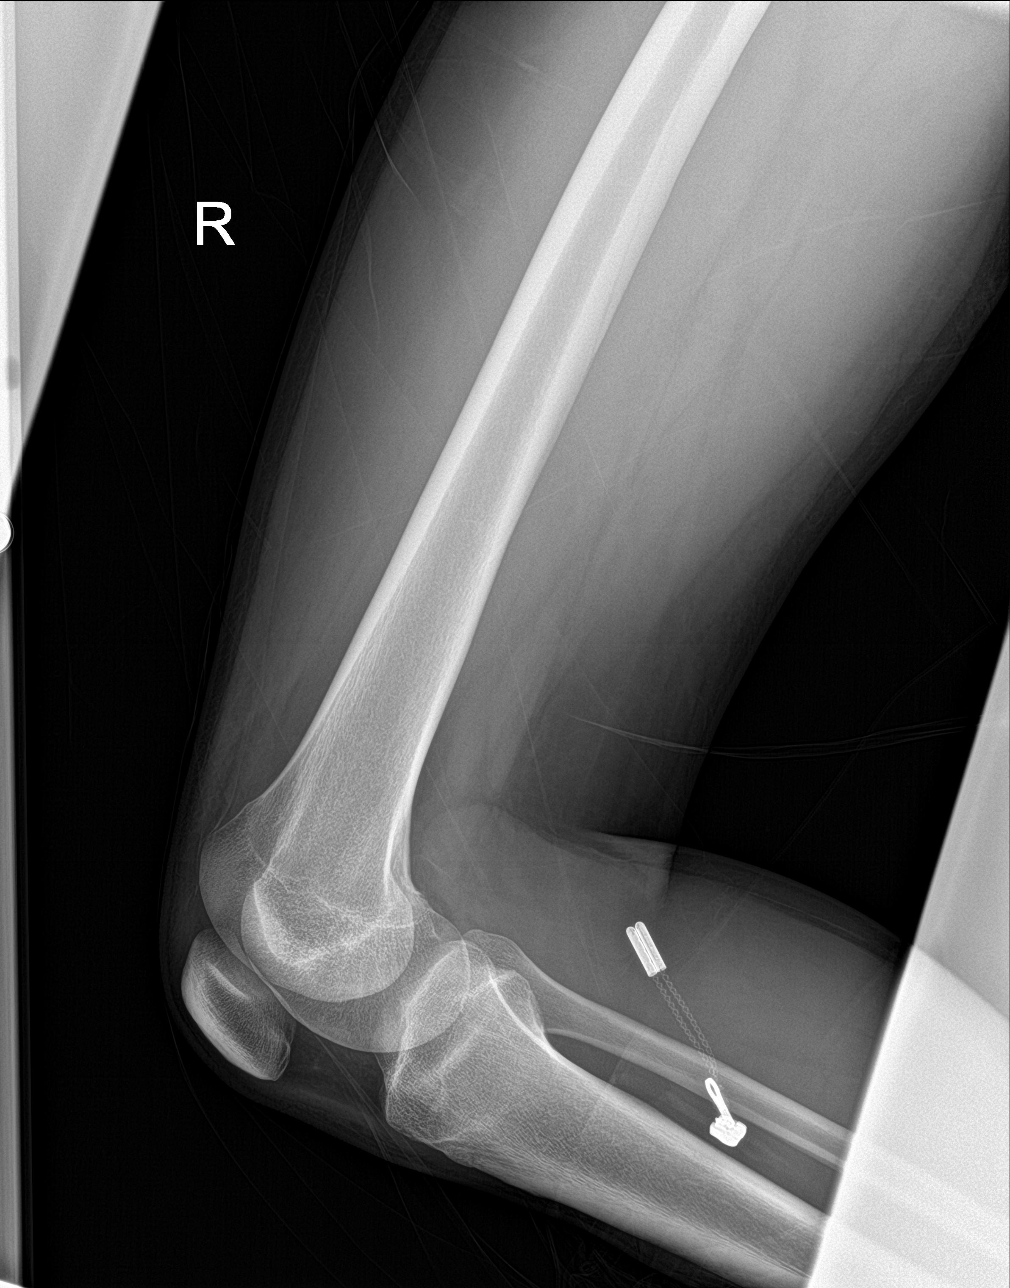

[4 of 4 positions shown; findings below may reference images not displayed]

FINDINGS: There is no evidence of fracture or other focal bone lesions. Soft
tissues are unremarkable.
IMPRESSION: Negative.

## 2021-07-15 ENCOUNTER — Encounter (HOSPITAL_COMMUNITY): Payer: Self-pay

## 2021-07-15 ENCOUNTER — Ambulatory Visit (HOSPITAL_COMMUNITY)
Admission: EM | Admit: 2021-07-15 | Discharge: 2021-07-15 | Disposition: A | Discharge status: Home / Self Care | Payer: Medicaid Other | Attending: Student | Admitting: Student

## 2021-07-15 ENCOUNTER — Other Ambulatory Visit: Payer: Self-pay

## 2021-07-15 DIAGNOSIS — Z113 Encounter for screening for infections with a predominantly sexual mode of transmission: Secondary | ICD-10-CM | POA: Diagnosis not present

## 2021-07-15 NOTE — Discharge Instructions (Addendum)
-  We have sent testing for sexually transmitted infections. We will notify you of any positive results once they are received. If required, we will prescribe any medications you might need. Please refrain from all sexual activity until treatment is complete.  -Seek additional medical attention if you develop fevers/chills, new/worsening abdominal pain, new/worsening penile discomfort/discharge, etc.   

## 2021-07-15 NOTE — ED Provider Notes (Signed)
[facility]    CSN: [id number] Arrival date & time: 07/15/21  1243      History   Chief Complaint No chief complaint on file.   HPI Demarri Elie is a 20 y.o. male presenting with STI screen.  Medical history noncontributory.  States that he and his girlfriend are both getting screened for STIs, but they denies symptoms or known exposures. Denies hematuria, dysuria, frequency, urgency, back pain, n/v/d/abd pain, fevers/chills, abdnormal penile discharge.    HPI  History reviewed. No pertinent past medical history.  There are no problems to display for this patient.   History reviewed. No pertinent surgical history.     Home Medications    Prior to Admission medications   Medication Sig Start Date End Date Taking? Authorizing Provider  doxycycline (VIBRAMYCIN) 100 MG capsule Take 1 capsule (100 mg total) by mouth 2 (two) times daily. 12/09/20   Valinda Hoar, NP  ibuprofen (ADVIL) 600 MG tablet Take 1 tablet (600 mg total) by mouth every 6 (six) hours as needed. 12/22/20   Jacalyn Lefevre, MD    Family History History reviewed. No pertinent family history.  Social History Social History   Tobacco Use   Smoking status: Never   Smokeless tobacco: Never  Substance Use Topics   Alcohol use: Never   Drug use: Yes    Frequency: 7.0 times per week    Types: Marijuana     Allergies   Patient has no known allergies.   Review of Systems Review of Systems  Constitutional:  Negative for chills and fever.  HENT:  Negative for sore throat.   Eyes:  Negative for pain and redness.  Respiratory:  Negative for shortness of breath.   Cardiovascular:  Negative for chest pain.  Gastrointestinal:  Negative for abdominal pain, diarrhea, nausea and vomiting.  Genitourinary:  Negative for decreased urine volume, difficulty urinating, dysuria, flank pain, frequency, genital sores, hematuria and urgency.  Musculoskeletal:  Negative for back pain.  Skin:   Negative for rash.  All other systems reviewed and are negative.   Physical Exam Triage Vital Signs ED Triage Vitals  Enc Vitals Group     BP 07/15/21 1423 (!) 122/58     Pulse Rate 07/15/21 1423 72     Resp 07/15/21 1423 16     Temp 07/15/21 1423 98.5 F (36.9 C)     Temp Source 07/15/21 1423 Oral     SpO2 07/15/21 1423 100 %     Weight --      Height --      Head Circumference --      Peak Flow --      Pain Score 07/15/21 1421 0     Pain Loc --      Pain Edu? --      Excl. in GC? --    No data found.  Updated Vital Signs BP (!) 122/58 (BP Location: Left Arm)   Pulse 72   Temp 98.5 F (36.9 C) (Oral)   Resp 16   SpO2 100%   Visual Acuity Right Eye Distance:   Left Eye Distance:   Bilateral Distance:    Right Eye Near:   Left Eye Near:    Bilateral Near:     Physical Exam Vitals reviewed.  Constitutional:      General: He is not in acute distress.    Appearance: Normal appearance. He is not ill-appearing.  HENT:     Head: Normocephalic and atraumatic.  Mouth/Throat:     Mouth: Mucous membranes are moist.     Comments: Moist mucous membranes Eyes:     Extraocular Movements: Extraocular movements intact.     Pupils: Pupils are equal, round, and reactive to light.  Cardiovascular:     Rate and Rhythm: Normal rate and regular rhythm.     Heart sounds: Normal heart sounds.  Pulmonary:     Effort: Pulmonary effort is normal.     Breath sounds: Normal breath sounds. No wheezing, rhonchi or rales.  Abdominal:     General: Bowel sounds are normal. There is no distension.     Palpations: Abdomen is soft. There is no mass.     Tenderness: There is no abdominal tenderness. There is no right CVA tenderness, left CVA tenderness, guarding or rebound.  Genitourinary:    Comments: deferred Skin:    General: Skin is warm.     Capillary Refill: Capillary refill takes less than 2 seconds.     Comments: Good skin turgor  Neurological:     General: No focal  deficit present.     Mental Status: He is alert and oriented to person, place, and time.  Psychiatric:        Mood and Affect: Mood normal.        Behavior: Behavior normal.     UC Treatments / Results  Labs (all labs ordered are listed, but only abnormal results are displayed) Labs Reviewed  CYTOLOGY, (ORAL, ANAL, URETHRAL) ANCILLARY ONLY    EKG   Radiology No results found.  Procedures Procedures (including critical care time)  Medications Ordered in UC Medications - No data to display  Initial Impression / Assessment and Plan / UC Course  I have reviewed the triage vital signs and the nursing notes.  Pertinent labs & imaging results that were available during my care of the patient were reviewed by me and considered in my medical decision making (see chart for details).     This patient is a very pleasant 20 y.o. year old male presenting with STI screen- asymptomatic. Afebrile, nontachycardic, no reproducible abd pain or CVAT.  Last STI screen negative 11/2020. Will send self-swab for G/C, trich. Declines HIV, RPR. Safe sex precautions.   ED return precautions discussed. Patient verbalizes understanding and agreement.    Final Clinical Impressions(s) / UC Diagnoses   Final diagnoses:  Routine screening for STI (sexually transmitted infection)     Discharge Instructions      -We have sent testing for sexually transmitted infections. We will notify you of any positive results once they are received. If required, we will prescribe any medications you might need. Please refrain from all sexual activity until treatment is complete.  -Seek additional medical attention if you develop fevers/chills, new/worsening abdominal pain, new/worsening penile discomfort/discharge, etc.       ED Prescriptions   None    PDMP not reviewed this encounter.   Rhys Martini, PA-C 07/15/21 1445

## 2021-07-15 NOTE — ED Triage Notes (Signed)
Pt presents for STD's test.  

## 2021-07-16 LAB — CYTOLOGY, (ORAL, ANAL, URETHRAL) ANCILLARY ONLY
Chlamydia: NEGATIVE
Comment: NEGATIVE
Comment: NEGATIVE
Comment: NORMAL
Neisseria Gonorrhea: NEGATIVE
Trichomonas: NEGATIVE

## 2023-02-16 ENCOUNTER — Telehealth: Payer: Self-pay

## 2023-02-16 NOTE — Telephone Encounter (Signed)
Attempted to contact patient with no answer. Unable to leave voicemail due to VM not being set up. AS, CMA
